# Patient Record
Sex: Male | Born: 1990 | Race: White | Hispanic: No | Marital: Married | Smoking: Never smoker
Health system: Southern US, Community
[De-identification: ages and names within clinical notes are randomized; demographics above are authoritative.]

## PROBLEM LIST (undated history)

## (undated) DIAGNOSIS — M549 Dorsalgia, unspecified: Secondary | ICD-10-CM

---

## 2016-04-28 ENCOUNTER — Emergency Department (INDEPENDENT_AMBULATORY_CARE_PROVIDER_SITE_OTHER)
Admission: EM | Admit: 2016-04-28 | Discharge: 2016-04-28 | Disposition: A | Discharge status: Home / Self Care | Payer: 59 | Source: Home / Self Care | Attending: Family Medicine | Admitting: Family Medicine

## 2016-04-28 ENCOUNTER — Encounter: Payer: Self-pay | Admitting: Emergency Medicine

## 2016-04-28 ENCOUNTER — Emergency Department (INDEPENDENT_AMBULATORY_CARE_PROVIDER_SITE_OTHER): Payer: 59

## 2016-04-28 DIAGNOSIS — M5410 Radiculopathy, site unspecified: Secondary | ICD-10-CM

## 2016-04-28 DIAGNOSIS — M545 Low back pain: Secondary | ICD-10-CM | POA: Diagnosis not present

## 2016-04-28 MED ORDER — MELOXICAM 15 MG PO TABS: 15.0000 mg | ORAL_TABLET | Freq: Every day | ORAL | Status: AC

## 2016-04-28 MED ORDER — PREDNISONE 20 MG PO TABS: ORAL_TABLET | ORAL | Status: DC

## 2016-04-28 NOTE — ED Notes (Signed)
Pt c/o low back pain x 8 days, was playing disc golf yesterday and threw the disc and pain worsen at that point.  Pt states it huts with taking steps as well.

## 2016-04-28 NOTE — Discharge Instructions (Signed)
Apply ice pack for 20 to 30 minutes, every 3 to 4 hours.  Continue until pain decreases.  Begin range of motion and stretching exercises. Take one prednisone tab today with supper, then follow bottle instructions.  Begin Mobic after finishing prednisone.

## 2016-04-28 NOTE — ED Provider Notes (Signed)
CSN: [id number]     Arrival date & time 04/28/16  1330 History   First MD Initiated Contact with Patient 04/28/16 1438     Chief Complaint  Patient presents with  . Back Pain      HPI Comments: Patient developed left lower back ache about 8 days ago while playing disc golf in a tournament.  The pain has gradually become worse and now radiates into his left posterior thigh.  The pain is worse when walking, and sitting with his legs crossed.  The pain improves when he is supine.  Patient is a 25 y.o. male presenting with back pain. The history is provided by the patient.  Back Pain Location:  Lumbar spine and sacro-iliac joint Quality:  Aching Radiates to:  L posterior upper leg Pain severity:  Moderate Pain is:  Same all the time Onset quality:  Sudden Duration:  8 days Timing:  Constant Progression:  Worsening Chronicity:  New Context: recent injury   Relieved by:  Ibuprofen and lying down Worsened by:  Movement, ambulation and twisting Ineffective treatments:  None tried Associated symptoms: no abdominal pain, no bladder incontinence, no bowel incontinence, no dysuria, no fever, no leg pain, no numbness, no paresthesias, no pelvic pain, no perianal numbness, no tingling, no weakness and no weight loss     History reviewed. No pertinent past medical history. History reviewed. No pertinent past surgical history. No family history on file. Social History  Substance Use Topics  . Smoking status: Never Smoker   . Smokeless tobacco: None  . Alcohol Use: Yes     Comment: 1 drink daily    Review of Systems  Constitutional: Negative for fever and weight loss.  Gastrointestinal: Negative for abdominal pain and bowel incontinence.  Genitourinary: Negative for bladder incontinence, dysuria and pelvic pain.  Musculoskeletal: Positive for back pain.  Neurological: Negative for tingling, weakness, numbness and paresthesias.  All other systems reviewed and are negative.   Allergies    Review of patient's allergies indicates no known allergies.  Home Medications   Prior to Admission medications   Medication Sig Start Date End Date Taking? Authorizing Provider  Ibuprofen (ADVIL PO) Take by mouth.   Yes Historical Provider, MD  meloxicam (MOBIC) 15 MG tablet Take 1 tablet (15 mg total) by mouth daily. Take with food each morning 04/28/16   Lattie HawStephen A Carlosdaniel Grob, MD  predniSONE (DELTASONE) 20 MG tablet Take one tab by mouth twice daily for 5 days, then one daily. Take with food. 04/28/16   Lattie HawStephen A Catalea Labrecque, MD   Meds Ordered and Administered this Visit  Medications - No data to display  BP 132/81 mmHg  Pulse 70  Temp(Src) 98.5 F (36.9 C) (Oral)  Ht 6\' 4"  (1.93 m)  Wt 218 lb 12 oz (99.224 kg)  BMI 26.64 kg/m2  SpO2 99% No data found.   Physical Exam  Constitutional: He is oriented to person, place, and time. He appears well-developed and well-nourished. No distress.  HENT:  Head: Normocephalic.  Mouth/Throat: Oropharynx is clear and moist.  Eyes: Conjunctivae are normal. Pupils are equal, round, and reactive to light.  Neck: Normal range of motion.  Cardiovascular: Normal heart sounds.   Pulmonary/Chest: Breath sounds normal.  Abdominal: There is no tenderness.  Musculoskeletal: He exhibits no edema.       Lumbar back: He exhibits decreased range of motion, tenderness, bony tenderness and pain. He exhibits no swelling and no edema.       Back:  Back:  Can heel/toe walk and squat without difficulty.  Decreased forward flexion.  Tenderness in the midline and bilateral paraspinous areas as noted on diagram.  Tenderness is especially prominent on the left from L4 to sacrum.    Straight leg raising test is positive on the left at about 15 degrees.  Sitting knee extension test is positive on the left at about 60 degrees from horizontal.  Strength and sensation in the lower extremities is normal.  Patellar and achilles reflexes are normal     Neurological: He is alert  and oriented to person, place, and time.  Skin: Skin is warm and dry. No rash noted.  Nursing note and vitals reviewed.   ED Course  Procedures none   Imaging Review Dg Lumbar Spine Complete  04/28/2016  CLINICAL DATA:  Low back pain for 8 days, no known injury, left posterior thigh pain EXAM: LUMBAR SPINE - COMPLETE 4+ VIEW COMPARISON:  None. FINDINGS: Five views of the lumbar spine submitted. No acute fracture or subluxation. Alignment, disc spaces and vertebral body heights are preserved. IMPRESSION: Negative. Electronically Signed   By: Natasha Mead M.D.   On: 04/28/2016 15:17     MDM   1. Acute low back pain with radicular symptoms, duration less than 6 weeks    Suspect piriformis syndrome. Begin prednisone burst/taper. Apply ice pack for 20 to 30 minutes, every 3 to 4 hours.  Continue until pain decreases.  Begin range of motion and stretching exercises. Take one prednisone tab today with supper, then follow bottle instructions.  Begin Mobic after finishing prednisone. Followup with Dr. Rodney Langton or Dr. Clement[facility] ([facility]) if not improving about two weeks.     Lattie Haw, MD 04/29/16 2038

## 2016-05-01 ENCOUNTER — Telehealth: Payer: Self-pay | Admitting: *Deleted

## 2017-12-26 IMAGING — DX DG LUMBAR SPINE COMPLETE 4+V
5 series · 5 of 5 positions shown · non-contrast
Comparison: None.

CLINICAL DATA: Low back pain for 8 days, no known injury, left
posterior thigh pain

EXAM:
LUMBAR SPINE - COMPLETE 4+ VIEW

[l-spine ap]
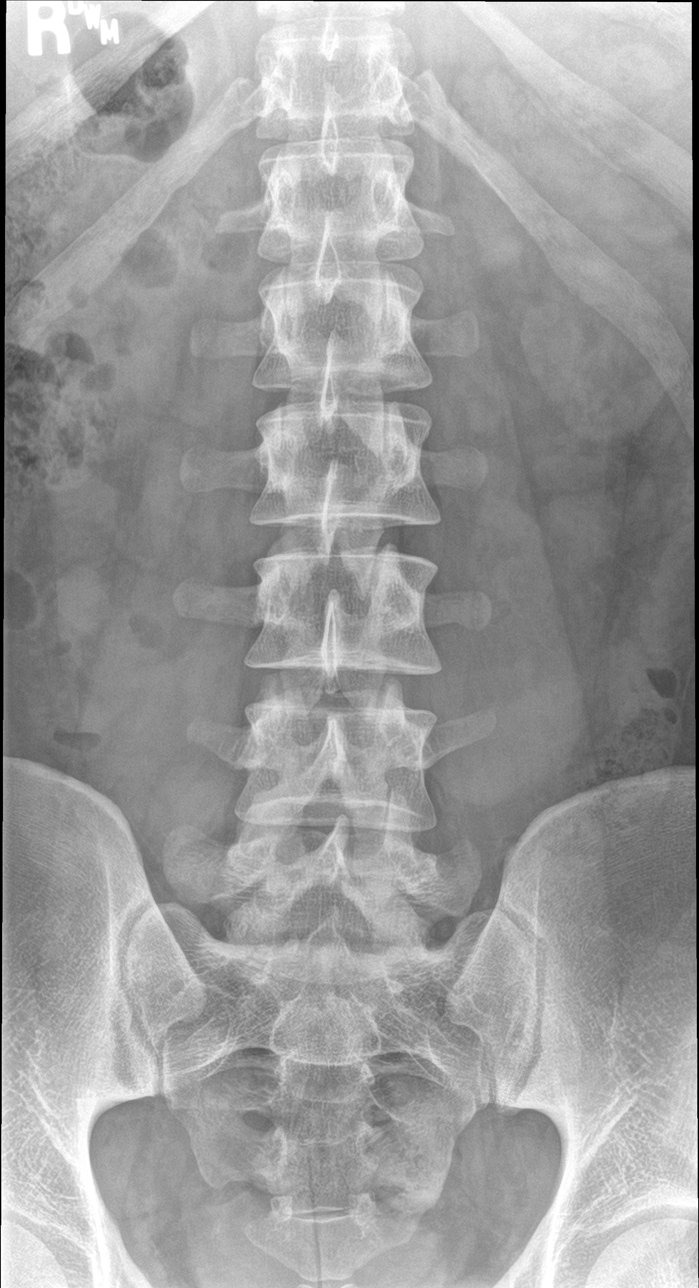

[l-spine obl (1 of 2)]
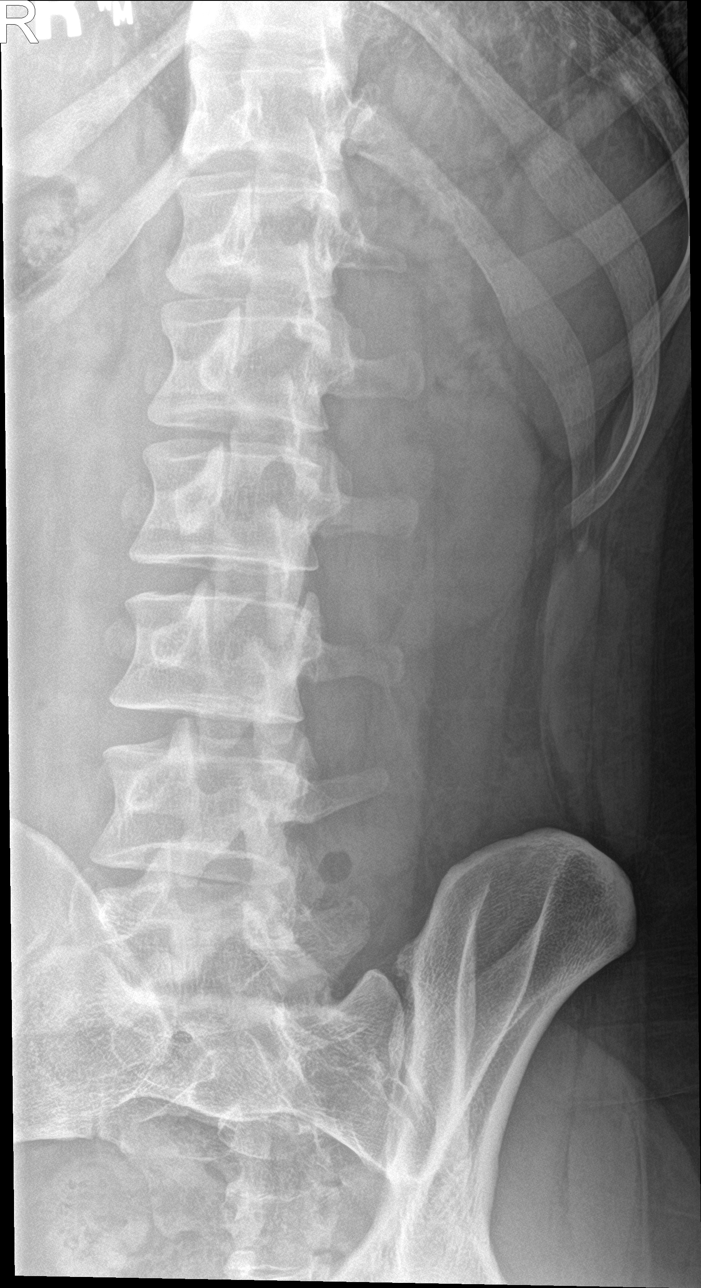

[l-spine obl (2 of 2)]
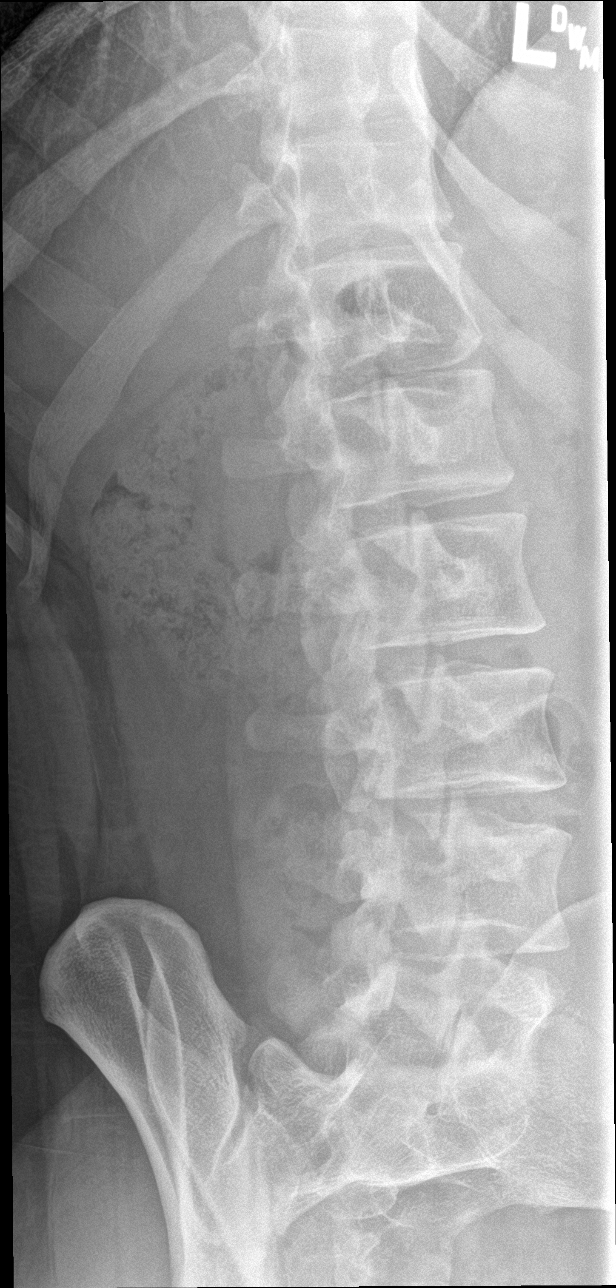

[l-spine lat]
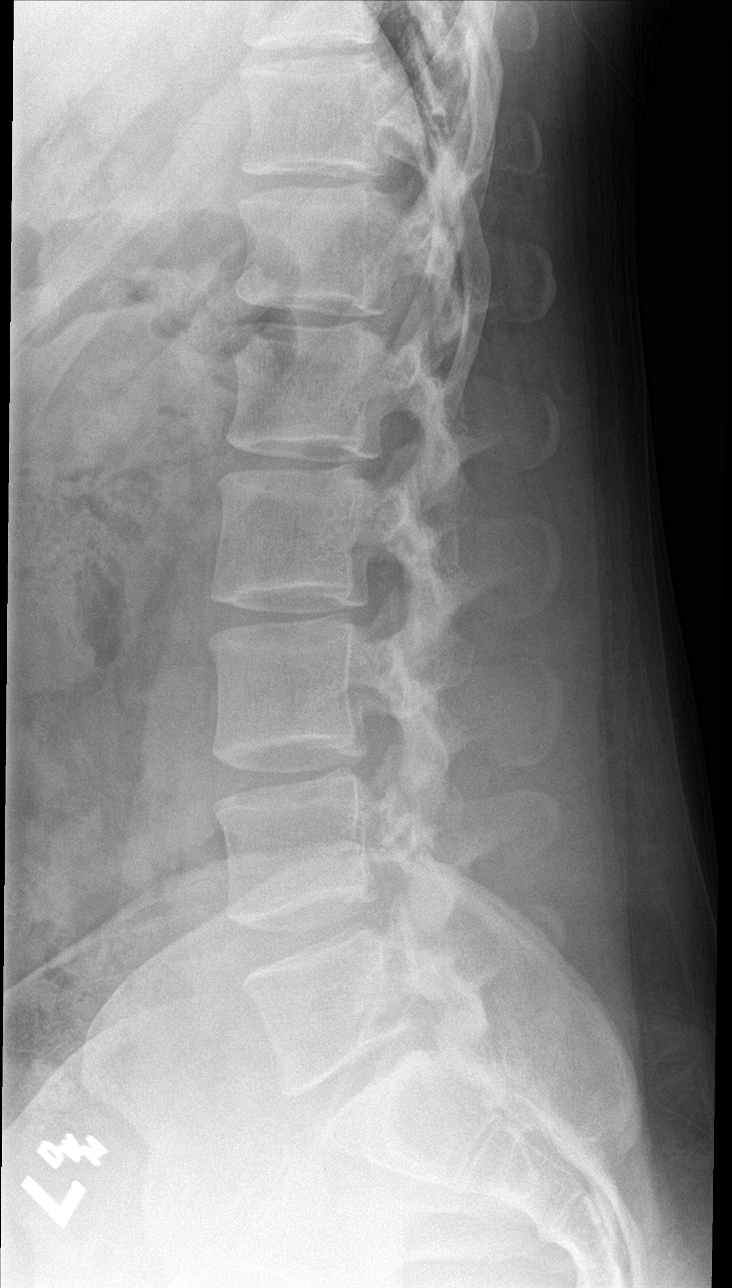

[l-spine spot]
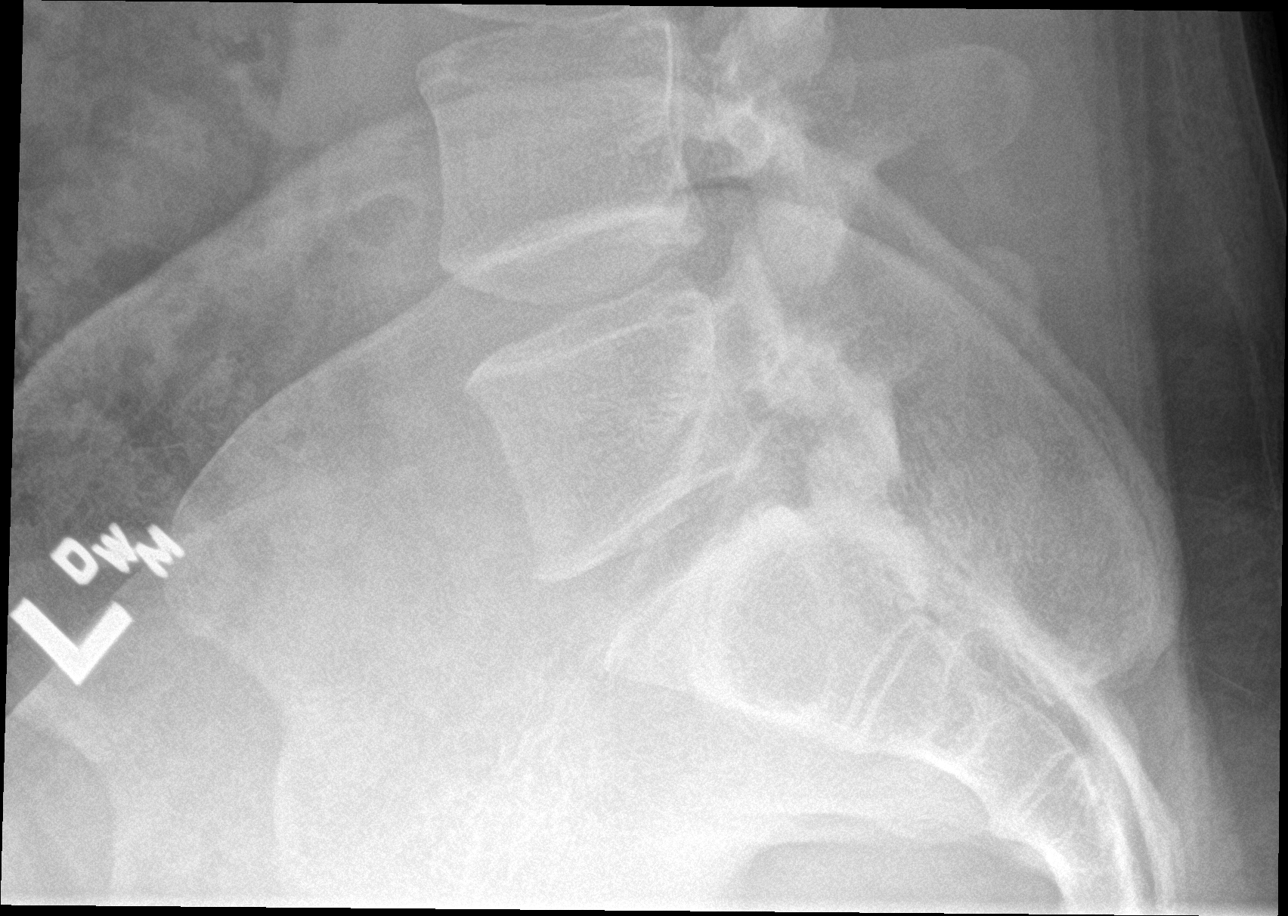

[5 of 5 positions shown; findings below may reference images not displayed]

FINDINGS: Five views of the lumbar spine submitted. No acute fracture or
subluxation. Alignment, disc spaces and vertebral body heights are
preserved.
IMPRESSION: Negative.

## 2019-10-12 ENCOUNTER — Encounter: Payer: Self-pay | Admitting: Emergency Medicine

## 2019-10-12 ENCOUNTER — Emergency Department
Admission: EM | Admit: 2019-10-12 | Discharge: 2019-10-12 | Disposition: A | Discharge status: Home / Self Care | Payer: 59 | Source: Home / Self Care | Attending: Emergency Medicine | Admitting: Emergency Medicine

## 2019-10-12 ENCOUNTER — Other Ambulatory Visit: Payer: Self-pay

## 2019-10-12 DIAGNOSIS — R52 Pain, unspecified: Secondary | ICD-10-CM | POA: Diagnosis not present

## 2019-10-12 DIAGNOSIS — J111 Influenza due to unidentified influenza virus with other respiratory manifestations: Secondary | ICD-10-CM

## 2019-10-12 DIAGNOSIS — R059 Cough, unspecified: Secondary | ICD-10-CM

## 2019-10-12 DIAGNOSIS — R05 Cough: Secondary | ICD-10-CM

## 2019-10-12 HISTORY — DX: Dorsalgia, unspecified: M54.9

## 2019-10-12 LAB — POCT INFLUENZA A/B
Influenza A, POC: NEGATIVE
Influenza B, POC: NEGATIVE

## 2019-10-12 NOTE — ED Provider Notes (Signed)
[facility]    CSN: [id number] Arrival date & time: 10/12/19  1149      History   Chief Complaint Chief Complaint  Patient presents with  . Nasal Congestion  . Sore Throat  . Otalgia    right  . Generalized Body Aches    HPI Darren Parsons is a 28 y.o. male.    Sore Throat  Otalgia Symptoms for 1-1/2 days Patient reports awakening yesterday with sneezing and runny nose; progressed to some myalgias, and low grade fever of 100 today; took tylenol this morning. Rare nonproductive cough.  +Decreased taste and smell the past day. Denies exposure to anyone with documented or suspected Covid-19.  Denies high risk exposures to people. He has not had flu vaccine this season. Has not travelled outside [place] past 4 weeks. His wife is a Marine scientist at [employer] and she is asymptomatic.   Has decreased appetite, but tolerating some liquids and solids by mouth.  No history of recent tick bite.   Review of Systems  + Minimal cough No  shortness of breath No  respiratory distress  + fatigue + mild nasal congestion.  Mild serous rhinorrhea without discolored mucus + Minimal sore throat, minimal otalgia without any ear drainage + mild swollen anterior neck glands  No  vomiting No  diarrhea No  acute vision changes No  stiff neck No  focal weakness No  syncope No  seizures No  GU symptoms No  new rash   Pertinent items noted in HPI and remainder of comprehensive ROS otherwise negative.   Past Medical History:  Diagnosis Date  . Back pain     There are no active problems to display for this patient.   History reviewed. No pertinent surgical history.     Home Medications    Prior to Admission medications   Medication Sig Start Date End Date Taking? Authorizing Provider  Ibuprofen (ADVIL PO) Take by mouth.    [provider]  meloxicam (MOBIC) 15 MG tablet Take 1 tablet (15 mg total) by mouth daily. Take with food each morning 04/28/16    Kandra Nicolas, MD    Family History No family history on file.  Social History Social History   Tobacco Use  . Smoking status: Never Smoker  Substance Use Topics  . Alcohol use: Yes    Comment: 1 drink daily  . Drug use: Not on file     Allergies   Patient has no known allergies.   Review of Systems Review of Systems  All other systems reviewed and are negative.  Pertinent items noted in HPI and remainder of comprehensive ROS otherwise negative.   Physical Exam Triage Vital Signs ED Triage Vitals [10/12/19 1219]  Enc Vitals Group     BP 131/84     Pulse Rate (!) 102     Resp 18     Temp 98.6 F (37 C)     Temp Source Oral     SpO2 96 %     Weight      Height      Head Circumference      Peak Flow      Pain Score      Pain Loc      Pain Edu?      Excl. in [place]?    No data found.  Updated Vital Signs BP 131/84 (BP Location: Right Arm)   Pulse (!) 102   Temp 98.6 F (37 C) (Oral)  Resp 18   Ht  (1.93 m)   Wt 106.6 kg   SpO2 96%   BMI 28.61 kg/m     Physical Exam Vitals signs and nursing note reviewed.  Constitutional:      General: He is not in acute distress.    Appearance: He is well-developed. He is ill-appearing (very fatigued, but no cardiorespiratory distress) and mildly diaphoretic. He is not toxic-appearing.  HENT:     Head: Normocephalic and atraumatic.     Right Ear: Tympanic membrane and external ear normal.     Left Ear: Tympanic membrane and external ear normal.     Nose: Rhinorrhea present.     Mouth/Throat:     Pharynx , clear.  No redness or lesions.  No oropharyngeal exudate.  No edema.  Airway intact. Eyes:     General: No scleral icterus.       Right eye: No discharge.        Left eye: No discharge.     Conjunctiva/sclera: Conjunctivae normal.  Neck:     Musculoskeletal: Neck supple.  Minimally enlarged, rubbery, mobile anterior cervical nodes. Cardiovascular:     Rate and Rhythm: Normal rate and regular  rhythm.     Heart sounds: Normal heart sounds.  No murmur gallop or rub. Pulmonary:     Effort: No respiratory distress.  Breath sounds equal bilaterally.    Breath sounds: Normal breath sounds. No stridor. No wheezing or rales.  Abdominal:     Palpations: Abdomen is soft.     Tenderness: There is no abdominal tenderness or masses Lymphadenopathy:     Cervical: Cervical adenopathy (mild shoddy anterior cervical nodes) present.  Skin:    General: Skin is warm.     Findings: No rash.  Neurological: No focal deficits    Mental Status: He is alert.     UC Treatments / Results  Labs (all labs ordered are listed, but only abnormal results are displayed) Labs Reviewed  NOVEL CORONAVIRUS, NAA  POCT INFLUENZA A/B    EKG   Radiology No results found.  Procedures Procedures (including critical care time)  Medications Ordered in UC Medications - No data to display  Initial Impression / Assessment and Plan / UC Course  I have reviewed the triage vital signs and the nursing notes.  Pertinent labs & imaging results that were available during my care of the patient were reviewed by me and considered in my medical decision making (see chart for details).      Final Clinical Impressions(s) / UC Diagnoses   Final diagnoses:  Body aches  Influenza-like illness  Cough  Discussed at length, and discussed with patient's wife by phone during the visit, with patient's permission. Verbal instructions given and printed discharge instructions below. Follow-up with your primary care doctor in 5-7 days if not improving, or sooner if symptoms become worse. Precautions discussed. Red flags discussed. Questions invited and answered. Patient and wife voiced understanding and agreement.    Discharge Instructions     You have viral, flu like, and Covid symptoms for 1-1/2 days. Physical exam within normal limits. Today, rapid flu test negative. Treatment is symptomatic.  May use Tylenol  or ibuprofen OTC if needed for pain or fever.  Rest.  Drink plenty of fluids. We are sending off nasal swab for Covid test.  Hopefully results back in 3 to 4 days.  You need to sign up/activate my chart.  Please check my chart for results in 2 to 4  days. In the meantime, you need to quarantine, even if negative Covid test, for 10 days after fever and symptoms get better. Please read attached instruction sheet on preventing the spread of COVID-19. If not improving in a week, follow-up here or with your PCP.  If any severe worsening symptoms, go to emergency room.      Lajean Manes, MD 10/13/19 1535

## 2019-10-12 NOTE — ED Triage Notes (Signed)
Patient reports awakening yesterday with sneezing and runny nose; progressed to some aches, sore throat and low grade fever of 100 today; took tylenol this morning.  He has not had influenza vacc this season.  Has not travelled outside [place] past 4 weeks.

## 2019-10-12 NOTE — Discharge Instructions (Addendum)
You have viral, flu like, and Covid symptoms for 1-1/2 days. Physical exam within normal limits. Today, rapid flu test negative. Treatment is symptomatic.  May use Tylenol or ibuprofen OTC if needed for pain or fever.  Rest.  Drink plenty of fluids. We are sending off nasal swab for Covid test.  Hopefully results back in 3 to 4 days.  You need to sign up/activate my chart.  Please check my chart for results in 2 to 4 days. In the meantime, you need to quarantine, even if negative Covid test, for 10 days after fever and symptoms get better. Please read attached instruction sheet on preventing the spread of COVID-19. If not improving in a week, follow-up here or with your PCP.  If any severe worsening symptoms, go to emergency room.

## 2019-10-13 LAB — NOVEL CORONAVIRUS, NAA: SARS-CoV-2, NAA: NOT DETECTED
